# Patient Record
Sex: Female | Born: 1995 | Hispanic: No | Marital: Single | Smoking: Never smoker
Health system: Southern US, Community
[De-identification: ages and names within clinical notes are randomized; demographics above are authoritative.]

## PROBLEM LIST (undated history)

## (undated) DIAGNOSIS — N289 Disorder of kidney and ureter, unspecified: Secondary | ICD-10-CM

## (undated) DIAGNOSIS — Q458 Other specified congenital malformations of digestive system: Secondary | ICD-10-CM

## (undated) DIAGNOSIS — Q6412 Cloacal extrophy of urinary bladder: Secondary | ICD-10-CM

## (undated) HISTORY — PX: ABDOMINAL SURGERY: SHX537

---

## 2017-10-12 ENCOUNTER — Encounter: Payer: Self-pay | Admitting: *Deleted

## 2017-10-12 ENCOUNTER — Ambulatory Visit
Admit: 2017-10-12 | Discharge: 2017-10-12 | Disposition: A | Discharge status: Home / Self Care | Payer: 59 | Source: Ambulatory Visit | Attending: Family Medicine | Admitting: Family Medicine

## 2017-10-12 ENCOUNTER — Other Ambulatory Visit: Payer: Self-pay

## 2017-10-12 ENCOUNTER — Ambulatory Visit
Admission: EM | Admit: 2017-10-12 | Discharge: 2017-10-12 | Disposition: A | Discharge status: Other Acute Inpatient Hospital | Payer: 59 | Attending: Family Medicine | Admitting: Family Medicine

## 2017-10-12 DIAGNOSIS — R1012 Left upper quadrant pain: Secondary | ICD-10-CM | POA: Insufficient documentation

## 2017-10-12 DIAGNOSIS — N1330 Unspecified hydronephrosis: Secondary | ICD-10-CM | POA: Diagnosis not present

## 2017-10-12 DIAGNOSIS — R1902 Left upper quadrant abdominal swelling, mass and lump: Secondary | ICD-10-CM

## 2017-10-12 HISTORY — DX: Cloacal exstrophy of urinary bladder: Q64.12

## 2017-10-12 HISTORY — DX: Other specified congenital malformations of digestive system: Q45.8

## 2017-10-12 HISTORY — DX: Disorder of kidney and ureter, unspecified: N28.9

## 2017-10-12 LAB — BASIC METABOLIC PANEL
ANION GAP: 7 (ref 5–15)
BUN: 11 mg/dL (ref 6–20)
CALCIUM: 8.8 mg/dL — AB (ref 8.9–10.3)
CHLORIDE: 103 mmol/L (ref 101–111)
CO2: 24 mmol/L (ref 22–32)
Creatinine, Ser: 0.65 mg/dL (ref 0.44–1.00)
GFR calc non Af Amer: >60 mL/min (ref >60)
GLUCOSE: 91 mg/dL (ref 65–99)
Potassium: 3.2 mmol/L — ABNORMAL LOW (ref 3.5–5.1)
Sodium: 134 mmol/L — ABNORMAL LOW (ref 135–145)

## 2017-10-12 LAB — URINALYSIS, COMPLETE (UACMP) WITH MICROSCOPIC
BILIRUBIN URINE: NEGATIVE
GLUCOSE, UA: NEGATIVE mg/dL
KETONES UR: NEGATIVE mg/dL
NITRITE: NEGATIVE
PH: 7.5 (ref 5.0–8.0)
Protein, ur: NEGATIVE mg/dL
SPECIFIC GRAVITY, URINE: 1.015 (ref 1.005–1.030)
SQUAMOUS EPITHELIAL / LPF: NONE SEEN

## 2017-10-12 MED ORDER — IOPAMIDOL (ISOVUE-300) INJECTION 61%
60.0000 mL | Freq: Once | INTRAVENOUS | Status: AC | PRN
  Administered 2017-10-12: 60 mL via INTRAVENOUS

## 2017-10-12 NOTE — Discharge Instructions (Signed)
We will call with the results.  Take care  Dr. Mauriana Dann  

## 2017-10-12 NOTE — ED Provider Notes (Addendum)
[facility]    CSN: [id number] Arrival date & time: 10/12/17  1258  History   Chief Complaint Chief Complaint  Patient presents with  . Abdominal Pain   HPI 22 year old female with an extensive past medical history presents with left upper quadrant abdominal pain and "knot".  Noted mass/and associated pain in the left upper quadrant 1 week ago.  4/10 in severity. No known inciting factor.  No known exacerbating relieving factors.  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  She is followed closely by urology and nephrology.  No other reported symptoms.  No other complaints or concerns at this time.  Past Medical History:  Diagnosis Date  . Cloacal exstrophy   . Renal disorder    Past Surgical History:  Procedure Laterality Date  . ABDOMINAL SURGERY      OB History    No data available     Home Medications    Prior to Admission medications   Medication Sig Start Date End Date Taking? Authorizing Provider  enalapril (VASOTEC) 5 MG tablet Take 5 mg by mouth daily.   Yes [provider]  nitrofurantoin (MACRODANTIN) 50 MG capsule Take 50 mg by mouth once.   Yes [provider]    Family History Family History  Adopted: Yes    Social History Social History   Tobacco Use  . Smoking status: Never Smoker  . Smokeless tobacco: Never Used  Substance Use Topics  . Alcohol use: No    Frequency: Never  . Drug use: No     Allergies   Latex   Review of Systems Review of Systems  Constitutional: Negative.   Gastrointestinal: Positive for abdominal pain.       Mass.   Physical Exam Triage Vital Signs ED Triage Vitals  Enc Vitals Group     BP 10/12/17 1328 (!) 107/56     Pulse Rate 10/12/17 1328 61     Resp 10/12/17 1328 16     Temp 10/12/17 1328 (!) 97.5 F (36.4 C)     Temp Source 10/12/17 1328 Oral     SpO2 10/12/17 1328 100 %     Weight 10/12/17 1329 104 lb (47.2 kg)     Height 10/12/17 1329 4'  9" (1.448 m)     Head Circumference --      Peak Flow --      Pain Score 10/12/17 1329 4     Pain Loc --      Pain Edu? --      Excl. in GC? --    Updated Vital Signs BP (!) 107/56 (BP Location: Left Arm)   Pulse 61   Temp (!) 97.5 F (36.4 C) (Oral)   Resp 16   Ht 4\' 9"  (1.448 m)   Wt 104 lb (47.2 kg)   LMP 09/28/2017   SpO2 100%   BMI 22.51 kg/m     Physical Exam  Constitutional: She is oriented to person, place, and time. She appears well-developed. No distress.  HENT:  Head: Normocephalic and atraumatic.  Pulmonary/Chest: Effort normal. No respiratory distress.  Abdominal: Soft. She exhibits no distension.  Midline scar noted. Firm left upper quadrant mass.  Uncertain etiology.  Tender to palpation.  Neurological: She is alert and oriented to person, place, and time.  Psychiatric: She has a normal mood and affect. Her behavior is normal.  Nursing note and vitals reviewed.  UC Treatments / Results  Labs (all labs ordered are listed,  but only abnormal results are displayed) Labs Reviewed  URINALYSIS, COMPLETE (UACMP) WITH MICROSCOPIC - Abnormal; Notable for the following components:      Result Value   APPearance HAZY (*)    Hgb urine dipstick TRACE (*)    Leukocytes, UA TRACE (*)    Bacteria, UA FEW (*)    All other components within normal limits  BASIC METABOLIC PANEL - Abnormal; Notable for the following components:   Sodium 134 (*)    Potassium 3.2 (*)    Calcium 8.8 (*)    All other components within normal limits    EKG  EKG Interpretation None       Radiology No results found.  Procedures Procedures (including critical care time)  Medications Ordered in UC Medications - No data to display   Initial Impression / Assessment and Plan / UC Course  I have reviewed the triage vital signs and the nursing notes.  Pertinent labs & imaging results that were available during my care of the patient were reviewed by me and considered in my medical  decision making (see chart for details).     22 year old female presents with left upper quadrant abdominal pain/mass.  Uncertain etiology and prognosis.  Arranging CT.  Final Clinical Impressions(s) / UC Diagnoses   Final diagnoses:  Left upper quadrant abdominal mass    ED Discharge Orders        Ordered    CT ABDOMEN PELVIS W CONTRAST     10/12/17 1442     Controlled Substance Prescriptions [place] Controlled Substance Registry consulted? Not Applicable   Tommie Sams, DO 10/12/17 1507    Everlene Other G, DO 10/12/17 [phone]

## 2017-10-12 NOTE — ED Triage Notes (Signed)
Patient scheduled for STAT CT abdomen and Pelvis with contrast. auth # [id number] to be done at [facility] at 4:30.

## 2017-10-12 NOTE — ED Triage Notes (Signed)
Patient noticed a know in her abdominal left upper quadrant 1 week ago. Patient has an extensive medical history.

## 2019-01-03 IMAGING — CT CT ABD-PELV W/ CM
2 of 4 series · 14 of 46 positions shown, 16 images · IV contrast (iopamidol)
Comparison: None.

CLINICAL DATA: 21-year-old female with history of left upper
quadrant abdominal pain and palpable knot in the left upper
quadrant. Pain reported as [DATE] in severity. History of
multiple abdominal surgeries for cloacal exstrophy.

EXAM:
CT ABDOMEN AND PELVIS WITH CONTRAST
TECHNIQUE: Multidetector CT imaging of the abdomen and pelvis was performed
using the standard protocol following bolus administration of
intravenous contrast.
CONTRAST:  60mL LIZDKV-2CC IOPAMIDOL (LIZDKV-2CC) INJECTION 61%

[Series 2: routine abd/pel with · axial · 0.68mm/px · z∈[-313,+57]mm · 11 of 88 slices shown, 13 images]
[im 7/88  soft-tissue]
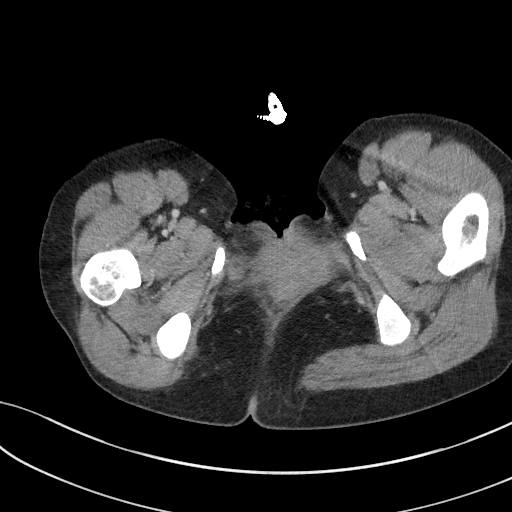
[im 7/88  bone]
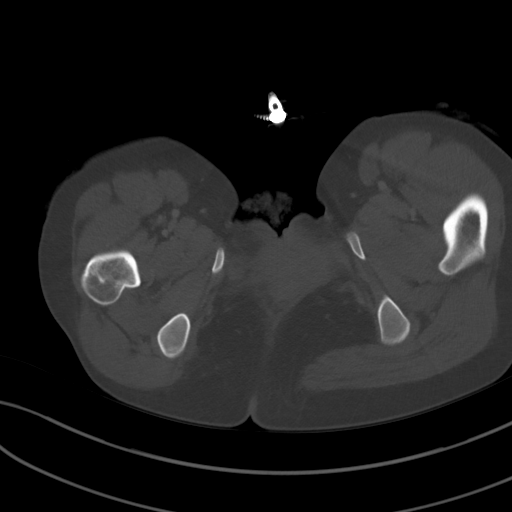
[im 14/88  soft-tissue]
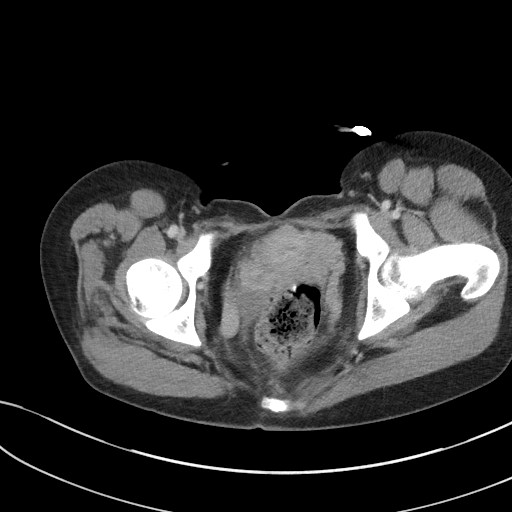
[im 21/88  soft-tissue]
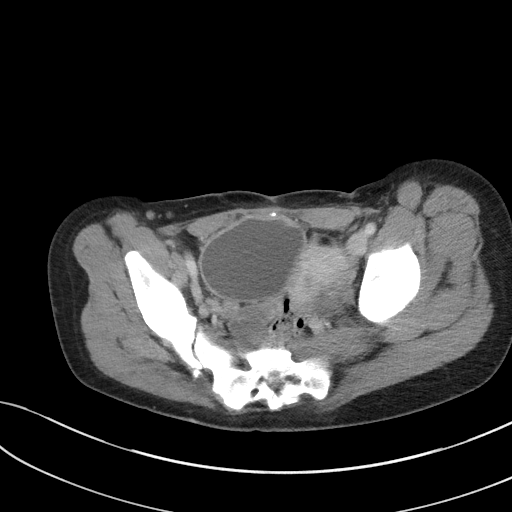
[im 28/88  soft-tissue]
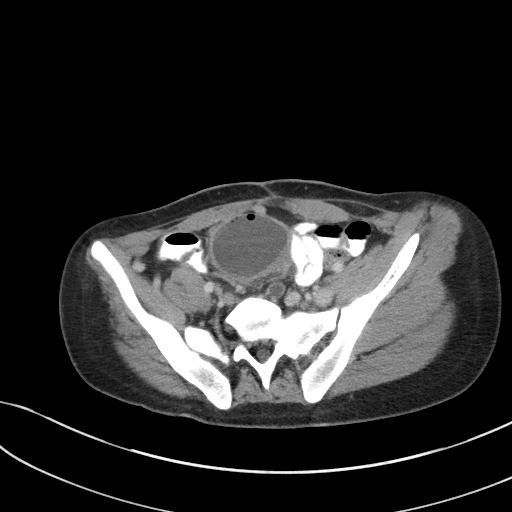
[im 35/88  soft-tissue]
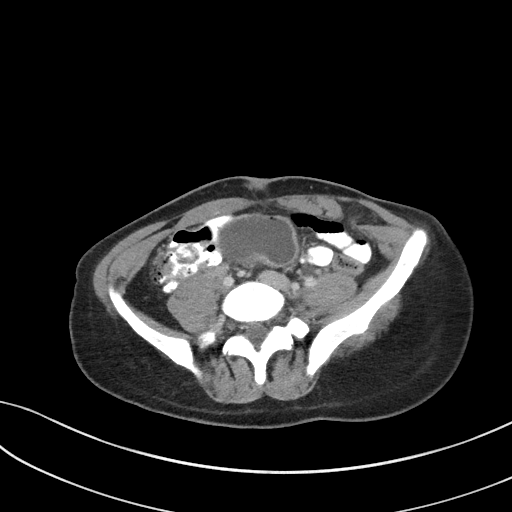
[im 46/88  soft-tissue]
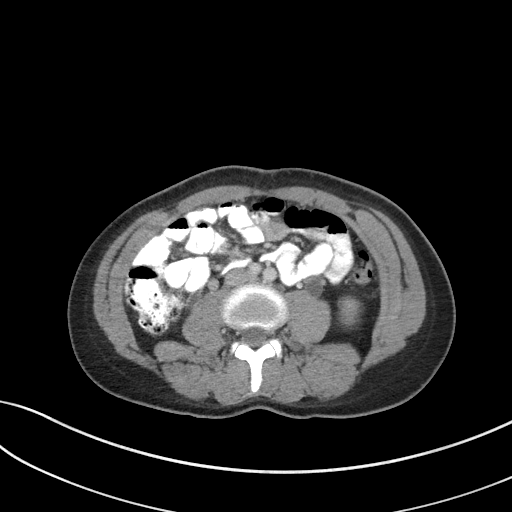
[im 53/88  soft-tissue]
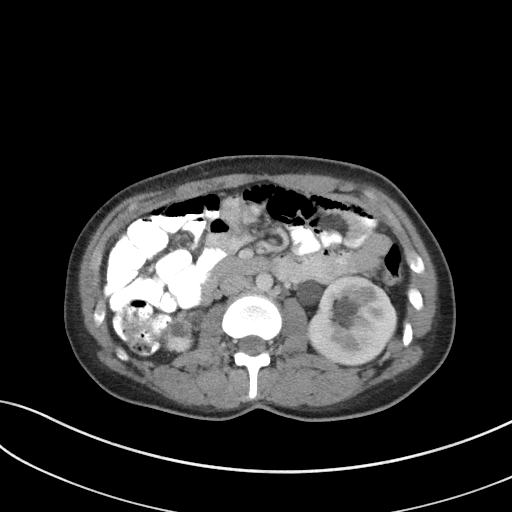
[im 60/88  soft-tissue]
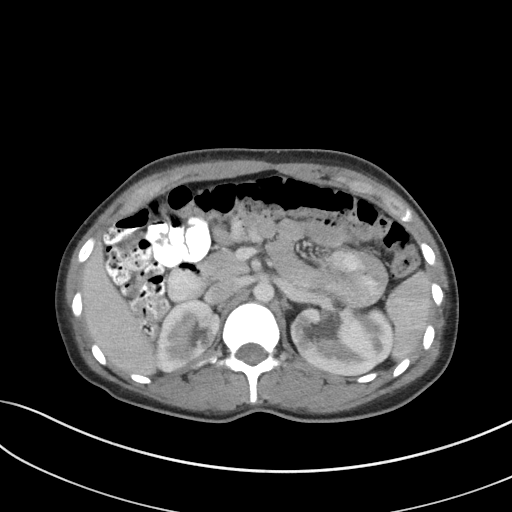
[im 67/88  soft-tissue]
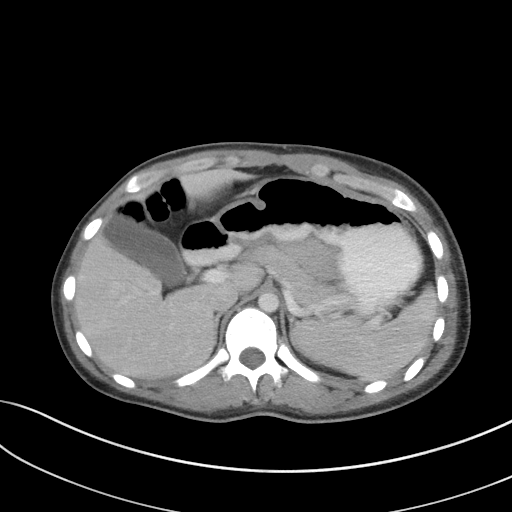
[im 67/88  bone]
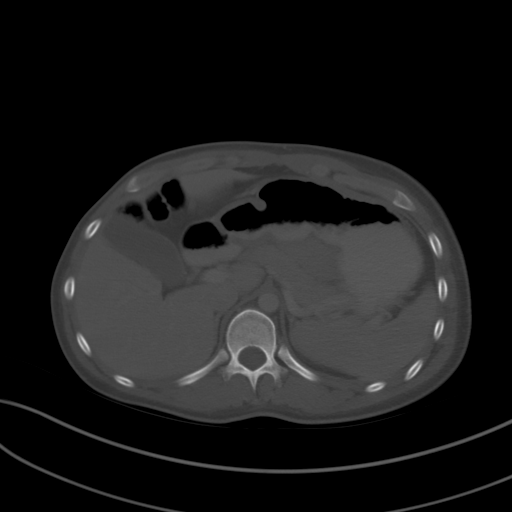
[im 74/88  soft-tissue]
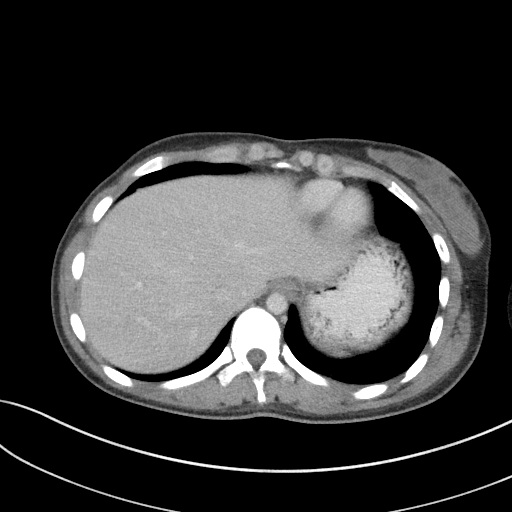
[im 81/88  soft-tissue]
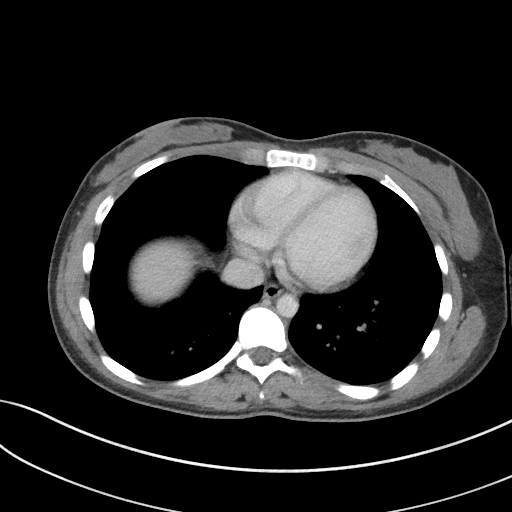

[Series 5: coronal st · coronal · 0.56mm/px · 3 of 65 slices shown]
[im 22/65  soft-tissue]
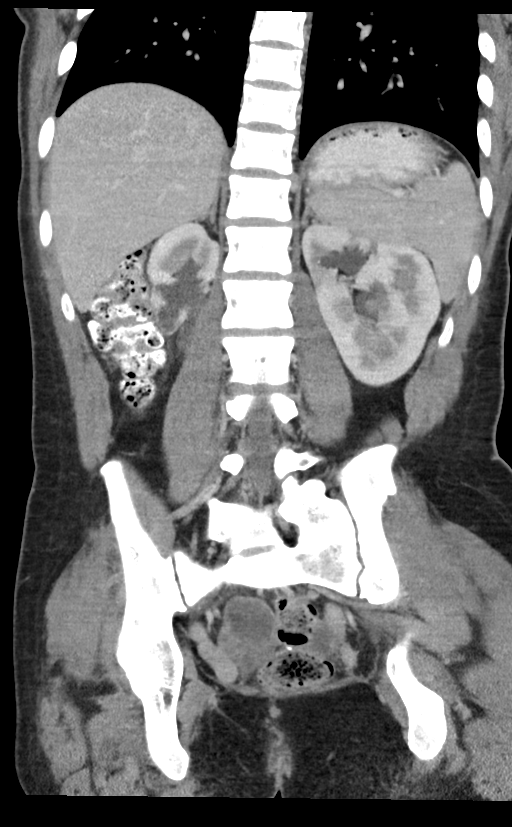
[im 29/65  soft-tissue]
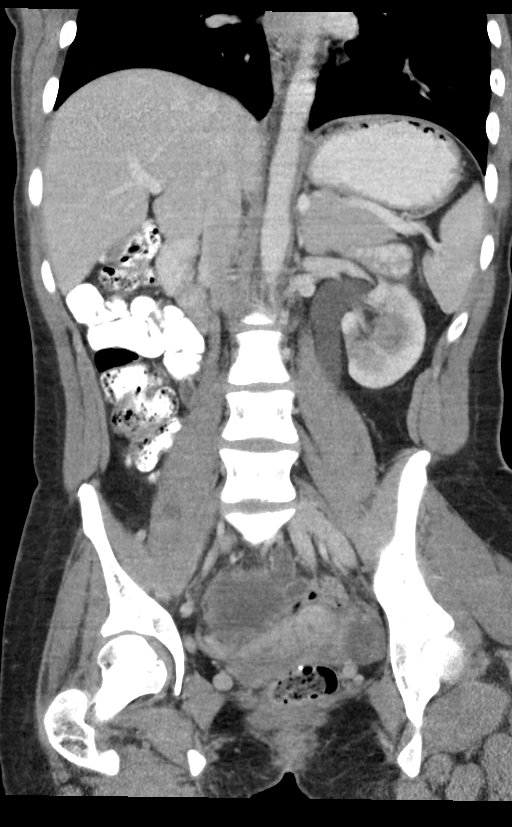
[im 36/65  soft-tissue]
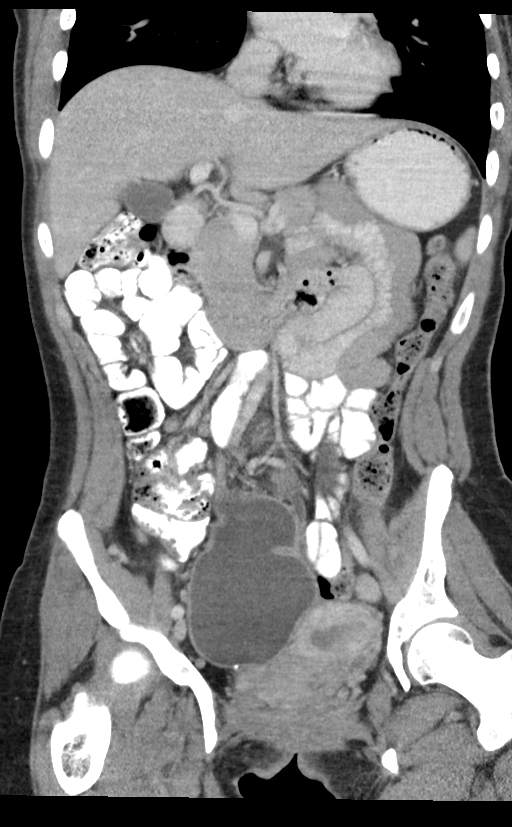

[14 of 46 positions shown; findings below may reference images not displayed]

FINDINGS: Lower chest: Unremarkable.

Hepatobiliary: No cystic or solid hepatic lesions. No intra or
extrahepatic biliary ductal dilatation. Gallbladder is normal in
appearance.

Pancreas: No pancreatic mass. No pancreatic ductal dilatation. No
pancreatic or peripancreatic fluid or inflammatory changes.

Spleen: Unremarkable.

Adrenals/Urinary Tract: Moderate left and mild right
hydroureteronephrosis. Atrophy of the right kidney, particularly
severe throughout the interpolar and lower pole region.
Subcentimeter low-attenuation lesion in the upper pole the left
kidney, too small to characterize, but statistically likely a cyst.
There is what is presumed to be a neobladder in the low anatomic
pelvis anteriorly, slightly to the right of midline. Some suture
material is noted anteriorly and inferiorly associated with this
presumed neobladder. Small amount of gas is present non dependently
within this structure. Bilateral adrenal glands are normal in
appearance.

Stomach/Bowel: Normal appearance of the stomach. No pathologic
dilatation of small bowel or colon. The appendix is not confidently
identified and may be surgically absent. Regardless, there are no
inflammatory changes noted adjacent to the cecum to suggest the
presence of an acute appendicitis at this time.

Vascular/Lymphatic: No significant atherosclerotic disease, aneurysm
or dissection noted in the abdominal or pelvic vasculature. No
lymphadenopathy noted in the abdomen or pelvis.

Reproductive: Uterus is likely subseptate, and is retroflexed.
Multiple low-attenuation lesions in both ovaries, presumably
follicles.

Other: No significant volume of ascites.  No pneumoperitoneum.

Musculoskeletal: Wide diastasis (7.6 cm) of the symphysis pubis.
There are no aggressive appearing lytic or blastic lesions noted in
the visualized portions of the skeleton.
IMPRESSION: 1. No acute findings are noted in the abdomen or pelvis to account
for the patient's history of abdominal pain or palpable knot in the
left upper quadrant.
2. Postoperative changes related to repair of cloacal exstrophy,
including neobladder formation. There is a small amount of gas
within the neobladder, which is presumably iatrogenic related to
recent urinalysis and/or self-catheterizations. In addition, there
is bilateral (left greater than right) hydroureteronephrosis, which
is likely chronic (but clinical correlation is recommended).

These results were called by telephone at the time of interpretation
on 10/12/2017 at [DATE] to Dr. BANCHLEM TSABARI, who verbally acknowledged
these results.
